# Patient Record
Sex: Female | Born: 1940 | Race: White | Hispanic: No | Marital: Married | Smoking: Never smoker
Health system: Southern US, Community
[De-identification: ages and names within clinical notes are randomized; demographics above are authoritative.]

## PROBLEM LIST (undated history)

## (undated) DIAGNOSIS — E119 Type 2 diabetes mellitus without complications: Secondary | ICD-10-CM

## (undated) DIAGNOSIS — I10 Essential (primary) hypertension: Secondary | ICD-10-CM

## (undated) DIAGNOSIS — E079 Disorder of thyroid, unspecified: Secondary | ICD-10-CM

## (undated) DIAGNOSIS — M1990 Unspecified osteoarthritis, unspecified site: Secondary | ICD-10-CM

---

## 2016-04-12 ENCOUNTER — Encounter (HOSPITAL_COMMUNITY): Payer: Self-pay

## 2016-04-12 ENCOUNTER — Emergency Department (HOSPITAL_COMMUNITY)
Admission: EM | Admit: 2016-04-12 | Discharge: 2016-04-12 | Disposition: A | Discharge status: Home / Self Care | Payer: Medicare Other | Attending: Emergency Medicine | Admitting: Emergency Medicine

## 2016-04-12 ENCOUNTER — Emergency Department (HOSPITAL_COMMUNITY): Payer: Medicare Other

## 2016-04-12 DIAGNOSIS — Y929 Unspecified place or not applicable: Secondary | ICD-10-CM | POA: Diagnosis not present

## 2016-04-12 DIAGNOSIS — S89392A Other physeal fracture of lower end of left fibula, initial encounter for closed fracture: Secondary | ICD-10-CM | POA: Insufficient documentation

## 2016-04-12 DIAGNOSIS — E119 Type 2 diabetes mellitus without complications: Secondary | ICD-10-CM | POA: Insufficient documentation

## 2016-04-12 DIAGNOSIS — R55 Syncope and collapse: Secondary | ICD-10-CM | POA: Diagnosis present

## 2016-04-12 DIAGNOSIS — S82892A Other fracture of left lower leg, initial encounter for closed fracture: Secondary | ICD-10-CM

## 2016-04-12 DIAGNOSIS — Y999 Unspecified external cause status: Secondary | ICD-10-CM | POA: Diagnosis not present

## 2016-04-12 DIAGNOSIS — W11XXXA Fall on and from ladder, initial encounter: Secondary | ICD-10-CM | POA: Diagnosis not present

## 2016-04-12 DIAGNOSIS — I10 Essential (primary) hypertension: Secondary | ICD-10-CM | POA: Insufficient documentation

## 2016-04-12 DIAGNOSIS — Y939 Activity, unspecified: Secondary | ICD-10-CM | POA: Insufficient documentation

## 2016-04-12 DIAGNOSIS — M1990 Unspecified osteoarthritis, unspecified site: Secondary | ICD-10-CM | POA: Insufficient documentation

## 2016-04-12 HISTORY — DX: Type 2 diabetes mellitus without complications: E11.9

## 2016-04-12 HISTORY — DX: Unspecified osteoarthritis, unspecified site: M19.90

## 2016-04-12 HISTORY — DX: Essential (primary) hypertension: I10

## 2016-04-12 HISTORY — DX: Disorder of thyroid, unspecified: E07.9

## 2016-04-12 LAB — CBC
HCT: 37.2 % (ref 36.0–46.0)
Hemoglobin: 12.5 g/dL (ref 12.0–15.0)
MCH: 29.4 pg (ref 26.0–34.0)
MCHC: 33.6 g/dL (ref 30.0–36.0)
MCV: 87.5 fL (ref 78.0–100.0)
PLATELETS: 227 10*3/uL (ref 150–400)
RBC: 4.25 MIL/uL (ref 3.87–5.11)
RDW: 14 % (ref 11.5–15.5)
WBC: 8.7 10*3/uL (ref 4.0–10.5)

## 2016-04-12 LAB — URINALYSIS, ROUTINE W REFLEX MICROSCOPIC
BILIRUBIN URINE: NEGATIVE
GLUCOSE, UA: NEGATIVE mg/dL
HGB URINE DIPSTICK: NEGATIVE
KETONES UR: NEGATIVE mg/dL
Leukocytes, UA: NEGATIVE
Nitrite: NEGATIVE
PROTEIN: NEGATIVE mg/dL
Specific Gravity, Urine: 1.021 (ref 1.005–1.030)
pH: 6 (ref 5.0–8.0)

## 2016-04-12 LAB — BASIC METABOLIC PANEL
Anion gap: 9 (ref 5–15)
BUN: 15 mg/dL (ref 6–20)
CHLORIDE: 103 mmol/L (ref 101–111)
CO2: 26 mmol/L (ref 22–32)
CREATININE: 0.89 mg/dL (ref 0.44–1.00)
Calcium: 9.4 mg/dL (ref 8.9–10.3)
GFR calc Af Amer: >60 mL/min (ref >60)
GFR calc non Af Amer: >60 mL/min (ref >60)
Glucose, Bld: 144 mg/dL — ABNORMAL HIGH (ref 65–99)
Potassium: 3.8 mmol/L (ref 3.5–5.1)
SODIUM: 138 mmol/L (ref 135–145)

## 2016-04-12 LAB — CBG MONITORING, ED: Glucose-Capillary: 170 mg/dL — ABNORMAL HIGH (ref 65–99)

## 2016-04-12 MED ORDER — OXYCODONE-ACETAMINOPHEN 5-325 MG PO TABS
1.0000 | ORAL_TABLET | Freq: Once | ORAL | Status: AC
  Administered 2016-04-12: 1 via ORAL
  Filled 2016-04-12: qty 1

## 2016-04-12 MED ORDER — OXYCODONE-ACETAMINOPHEN 5-325 MG PO TABS: 1.0000 | ORAL_TABLET | ORAL | Status: AC | PRN

## 2016-04-12 NOTE — ED Notes (Signed)
CBG 170 

## 2016-04-12 NOTE — ED Provider Notes (Signed)
CSN: [id number]     Arrival date & time 04/12/16  1441 History   First MD Initiated Contact with Patient 04/12/16 1625     Chief Complaint  Patient presents with  . Near Syncope  . Fall     (Consider location/radiation/quality/duration/timing/severity/associated sxs/prior Treatment) HPI Comments: Patient here complaining of pain to her left ankle after she fell off a stool today. States that she was standing on the stool when she became lightheaded and dizzy. Denied any chest pain or shortness of breath. She fell onto her left hip. No loss of consciousness or head injury. Denies any head or neck pain at this time. Does note some sharp left ankle pain is worse with trying to stand on it. Notes mild left hip pain without radicular symptoms. Denies any back discomfort. No knee pain noted. No treatment use prior to arrival  Patient is a 75 y.o. female presenting with near-syncope and fall. The history is provided by the patient.  Near Syncope  Fall    Past Medical History  Diagnosis Date  . Diabetes mellitus without complication (HCC)   . Hypertension   . Thyroid disease   . Arthritis    History reviewed. No pertinent past surgical history. History reviewed. No pertinent family history. Social History  Substance Use Topics  . Smoking status: Never Smoker   . Smokeless tobacco: Never Used  . Alcohol Use: No   OB History    No data available     Review of Systems  Cardiovascular: Positive for near-syncope.  All other systems reviewed and are negative.     Allergies  Bactrim and Penicillins  Home Medications   Prior to Admission medications   Not on File   BP 149/68 mmHg  Pulse 81  Temp(Src) 97.6 F (36.4 C) (Oral)  Resp 16  Ht 5' (1.524 m)  Wt 82.555 kg  BMI 35.54 kg/m2  SpO2 99% Physical Exam  Constitutional: She is oriented to person, place, and time. She appears well-developed and well-nourished.  Non-toxic appearance. No distress.  HENT:  Head:  Normocephalic and atraumatic.  Eyes: Conjunctivae, EOM and lids are normal. Pupils are equal, round, and reactive to light.  Neck: Normal range of motion. Neck supple. No tracheal deviation present. No thyroid mass present.  Cardiovascular: Normal rate, regular rhythm and normal heart sounds.  Exam reveals no gallop.   No murmur heard. Pulmonary/Chest: Effort normal and breath sounds normal. No stridor. No respiratory distress. She has no decreased breath sounds. She has no wheezes. She has no rhonchi. She has no rales.  Abdominal: Soft. Normal appearance and bowel sounds are normal. She exhibits no distension. There is no tenderness. There is no rebound and no CVA tenderness.  Musculoskeletal: Normal range of motion. She exhibits no edema or tenderness.       Legs:      Feet:  Neurological: She is alert and oriented to person, place, and time. She has normal strength. No cranial nerve deficit or sensory deficit. GCS eye subscore is 4. GCS verbal subscore is 5. GCS motor subscore is 6.  Skin: Skin is warm and dry. No abrasion and no rash noted.  Psychiatric: She has a normal mood and affect. Her speech is normal and behavior is normal.  Nursing note and vitals reviewed.   ED Course  Procedures (including critical care time) Labs Review Labs Reviewed  URINALYSIS, ROUTINE W REFLEX MICROSCOPIC (NOT AT [facility]) - Abnormal; Notable for the following:    APPearance CLOUDY (*)  All other components within normal limits  CBG MONITORING, ED - Abnormal; Notable for the following:    Glucose-Capillary 170 (*)    All other components within normal limits  CBC  BASIC METABOLIC PANEL    Imaging Review No results found. I have personally reviewed and evaluated these images and lab results as part of my medical decision-making.   EKG Interpretation   Date/Time:  Friday Apr 12 2016 15:28:40 EDT Ventricular Rate:  80 PR Interval:  136 QRS Duration: 81 QT Interval:  387 QTC Calculation: 446 R  Axis:   80 Text Interpretation:  Sinus rhythm Low voltage, precordial leads Confirmed  by Helyn Schwan  MD, Chaun Uemura ([id number]) on 04/12/2016 4:26:33 PM      MDM   Final diagnoses:  None   Patient is not orthostatic. Splint to be applied by orthopedic tech. Will give a dose of pain medication here and give referral to orthopedics on call    Lorre NickAnthony Asees Manfredi, MD 04/12/16 1816

## 2016-04-12 NOTE — ED Notes (Signed)
Pt reports feeling dizzy while on ladder then falling to the ground. Pt denies blood thinner use. Pt denies hitting her head. Pt c/o 8/10 left ankle, leg, and hip pain. Pt required assistance transition from POV to wheelchair. Pt A+OX4, speaking in complete sentences, in wheelchair.

## 2016-04-12 NOTE — Discharge Instructions (Signed)
Cast or Splint Care  Casts and splints support injured limbs and keep bones from moving while they heal. It is important to care for your cast or splint at home.    HOME CARE INSTRUCTIONS  · Keep the cast or splint uncovered during the drying period. It can take 24 to 48 hours to dry if it is made of plaster. A fiberglass cast will dry in less than 1 hour.  · Do not rest the cast on anything harder than a pillow for the first 24 hours.  · Do not put weight on your injured limb or apply pressure to the cast until your health care provider gives you permission.  · Keep the cast or splint dry. Wet casts or splints can lose their shape and may not support the limb as well. A wet cast that has lost its shape can also create harmful pressure on your skin when it dries. Also, wet skin can become infected.    Cover the cast or splint with a plastic bag when bathing or when out in the rain or snow. If the cast is on the trunk of the body, take sponge baths until the cast is removed.    If your cast does become wet, dry it with a towel or a blow dryer on the cool setting only.  · Keep your cast or splint clean. Soiled casts may be wiped with a moistened cloth.  · Do not place any hard or soft foreign objects under your cast or splint, such as cotton, toilet paper, lotion, or powder.  · Do not try to scratch the skin under the cast with any object. The object could get stuck inside the cast. Also, scratching could lead to an infection. If itching is a problem, use a blow dryer on a cool setting to relieve discomfort.  · Do not trim or cut your cast or remove padding from inside of it.  · Exercise all joints next to the injury that are not immobilized by the cast or splint. For example, if you have a long leg cast, exercise the hip joint and toes. If you have an arm cast or splint, exercise the shoulder, elbow, thumb, and fingers.  · Elevate your injured arm or leg on 1 or 2 pillows for the first 1 to 3 days to decrease  swelling and pain. It is best if you can comfortably elevate your cast so it is higher than your heart.  SEEK MEDICAL CARE IF:   · Your cast or splint cracks.  · Your cast or splint is too tight or too loose.  · You have unbearable itching inside the cast.  · Your cast becomes wet or develops a soft spot or area.  · You have a bad smell coming from inside your cast.  · You get an object stuck under your cast.  · Your skin around the cast becomes red or raw.  · You have new pain or worsening pain after the cast has been applied.  SEEK IMMEDIATE MEDICAL CARE IF:   · You have fluid leaking through the cast.  · You are unable to move your fingers or toes.  · You have discolored (blue or white), cool, painful, or very swollen fingers or toes beyond the cast.  · You have tingling or numbness around the injured area.  · You have severe pain or pressure under the cast.  · You have any difficulty with your breathing or have shortness of breath.  · You   have chest pain.     This information is not intended to replace advice given to you by your health care provider. Make sure you discuss any questions you have with your health care provider.     Document Released: 11/22/2000 Document Revised: 09/15/2013 Document Reviewed: 06/03/2013  Elsevier Interactive Patient Education ©2016 [employer]  Ankle Fracture  A fracture is a break in a bone. The ankle joint is made up of three bones. These include the lower (distal) sections of your lower leg bones, called the tibia and fibula, along with a bone in your foot, called the talus. Depending on how bad the break is and if more than one ankle joint bone is broken, a cast or splint is used to protect and keep your injured bone from moving while it heals. Sometimes, surgery is required to help the fracture heal properly.   There are two general types of fractures:  · Stable fracture. This includes a single fracture line through one bone, with no injury to ankle ligaments. A fracture of  the talus that does not have any displacement (movement of the bone on either side of the fracture line) is also stable.  · Unstable fracture. This includes more than one fracture line through one or more bones in the ankle joint. It also includes fractures that have displacement of the bone on either side of the fracture line.  CAUSES  · A direct blow to the ankle.    · Quickly and severely twisting your ankle.  · Trauma, such as a car accident or falling from a significant height.  RISK FACTORS  You may be at a higher risk of ankle fracture if:  · You have certain medical conditions.  · You are involved in high-impact sports.  · You are involved in a high-impact car accident.  SIGNS AND SYMPTOMS   · Tender and swollen ankle.  · Bruising around the injured ankle.  · Pain on movement of the ankle.  · Difficulty walking or putting weight on the ankle.  · A cold foot below the site of the ankle injury. This can occur if the blood vessels passing through your injured ankle were also damaged.  · Numbness in the foot below the site of the ankle injury.  DIAGNOSIS   An ankle fracture is usually diagnosed with a physical exam and X-rays. A CT scan may also be required for complex fractures.  TREATMENT   Stable fractures are treated with a cast or splint and using crutches to avoid putting weight on your injured ankle. This is followed by an ankle strengthening program. Some patients require a special type of cast, depending on other medical problems they may have. Unstable fractures require surgery to ensure the bones heal properly. Your health care provider will tell you what type of fracture you have and the best treatment for your condition.  HOME CARE INSTRUCTIONS   · Review correct crutch use with your health care provider and use your crutches as directed. Safe use of crutches is extremely important. Misuse of crutches can cause you to fall or cause injury to nerves in your hands or armpits.  · Do not put weight or  pressure on the injured ankle until directed by your health care provider.  · To lessen the swelling, keep the injured leg elevated while sitting or lying down.  · Apply ice to the injured area:    Put ice in a plastic bag.    Place a towel between your   cast and the bag.    Leave the ice on for 20 minutes, 2-3 times a day.  · If you have a plaster or fiberglass cast:    Do not try to scratch the skin under the cast with any objects. This can increase your risk of skin infection.    Check the skin around the cast every day. You may put lotion on any red or sore areas.    Keep your cast dry and clean.  · If you have a plaster splint:    Wear the splint as directed.    You may loosen the elastic around the splint if your toes become numb, tingle, or turn cold or blue.  · Do not put pressure on any part of your cast or splint; it may break. Rest your cast only on a pillow the first 24 hours until it is fully hardened.  · Your cast or splint can be protected during bathing with a plastic bag sealed to your skin with medical tape. Do not lower the cast or splint into water.  · Take medicines as directed by your health care provider. Only take over-the-counter or prescription medicines for pain, discomfort, or fever as directed by your health care provider.  · Do not drive a vehicle until your health care provider specifically tells you it is safe to do so.  · If your health care provider has given you a follow-up appointment, it is very important to keep that appointment. Not keeping the appointment could result in a chronic or permanent injury, pain, and disability. If you have any problem keeping the appointment, call the facility for assistance.  SEEK MEDICAL CARE IF:  You develop increased swelling or discomfort.  SEEK IMMEDIATE MEDICAL CARE IF:   · Your cast gets damaged or breaks.  · You have continued severe pain.  · You develop new pain or swelling after the cast was put on.  · Your skin or toenails below the  injury turn blue or gray.  · Your skin or toenails below the injury feel cold, numb, or have loss of sensitivity to touch.  · There is a bad smell or pus draining from under the cast.  MAKE SURE YOU:   · Understand these instructions.  · Will watch your condition.  · Will get help right away if you are not doing well or get worse.     This information is not intended to replace advice given to you by your health care provider. Make sure you discuss any questions you have with your health care provider.     Document Released: 11/22/2000 Document Revised: 11/30/2013 Document Reviewed: 06/24/2013  Elsevier Interactive Patient Education ©2016 [employer]

## 2017-02-14 IMAGING — CR DG HIP (WITH OR WITHOUT PELVIS) 2-3V*L*
3 series · 3 of 3 positions shown · non-contrast
Comparison: None.

CLINICAL DATA: Left hip pain after fall

EXAM:
DG HIP (WITH OR WITHOUT PELVIS) 2-3V LEFT

[x pelvis]
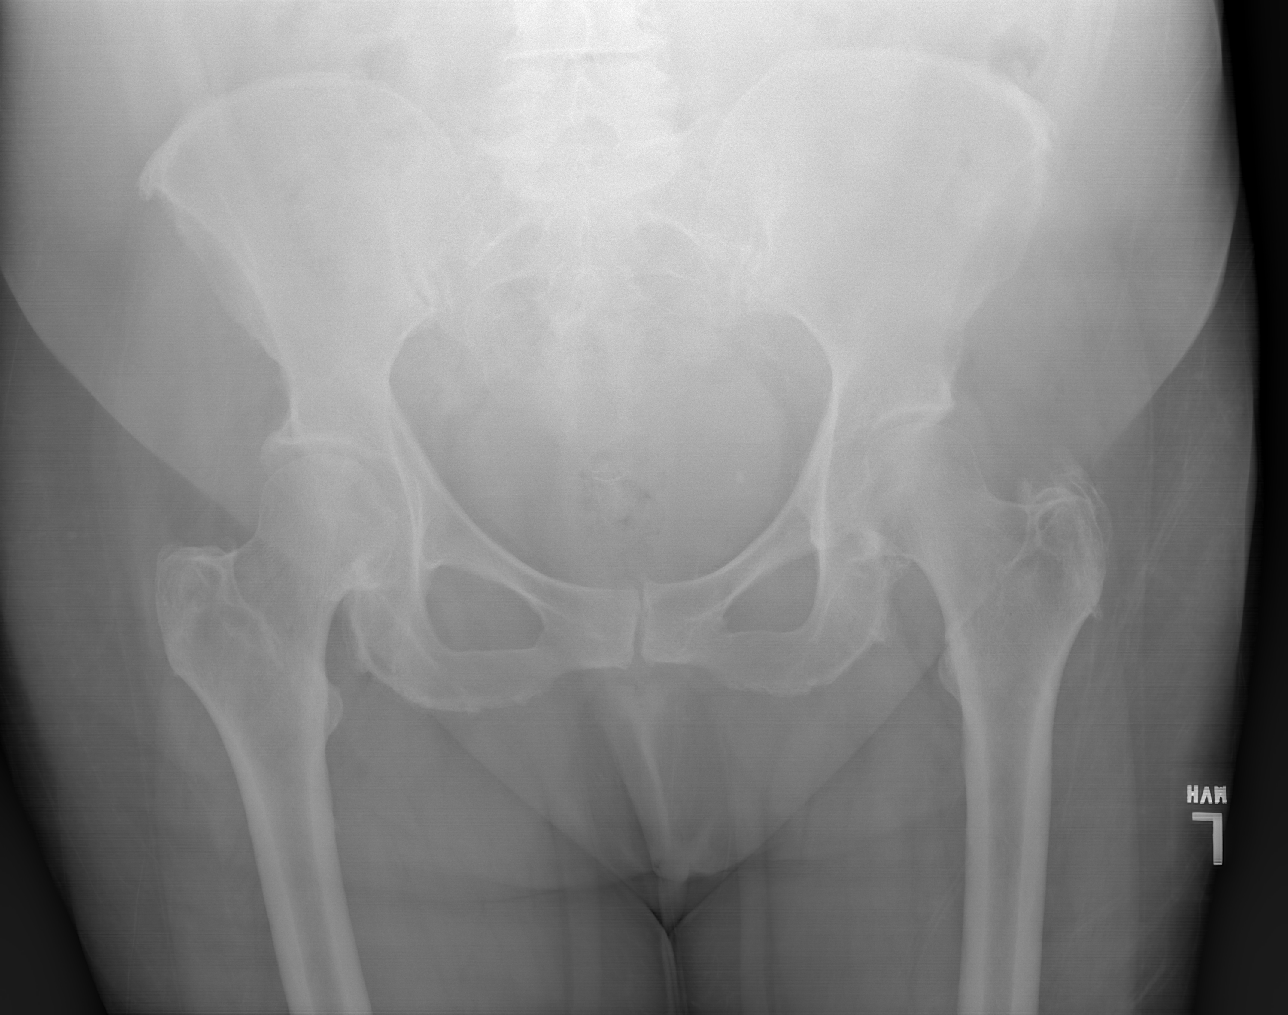

[x hip ap left]
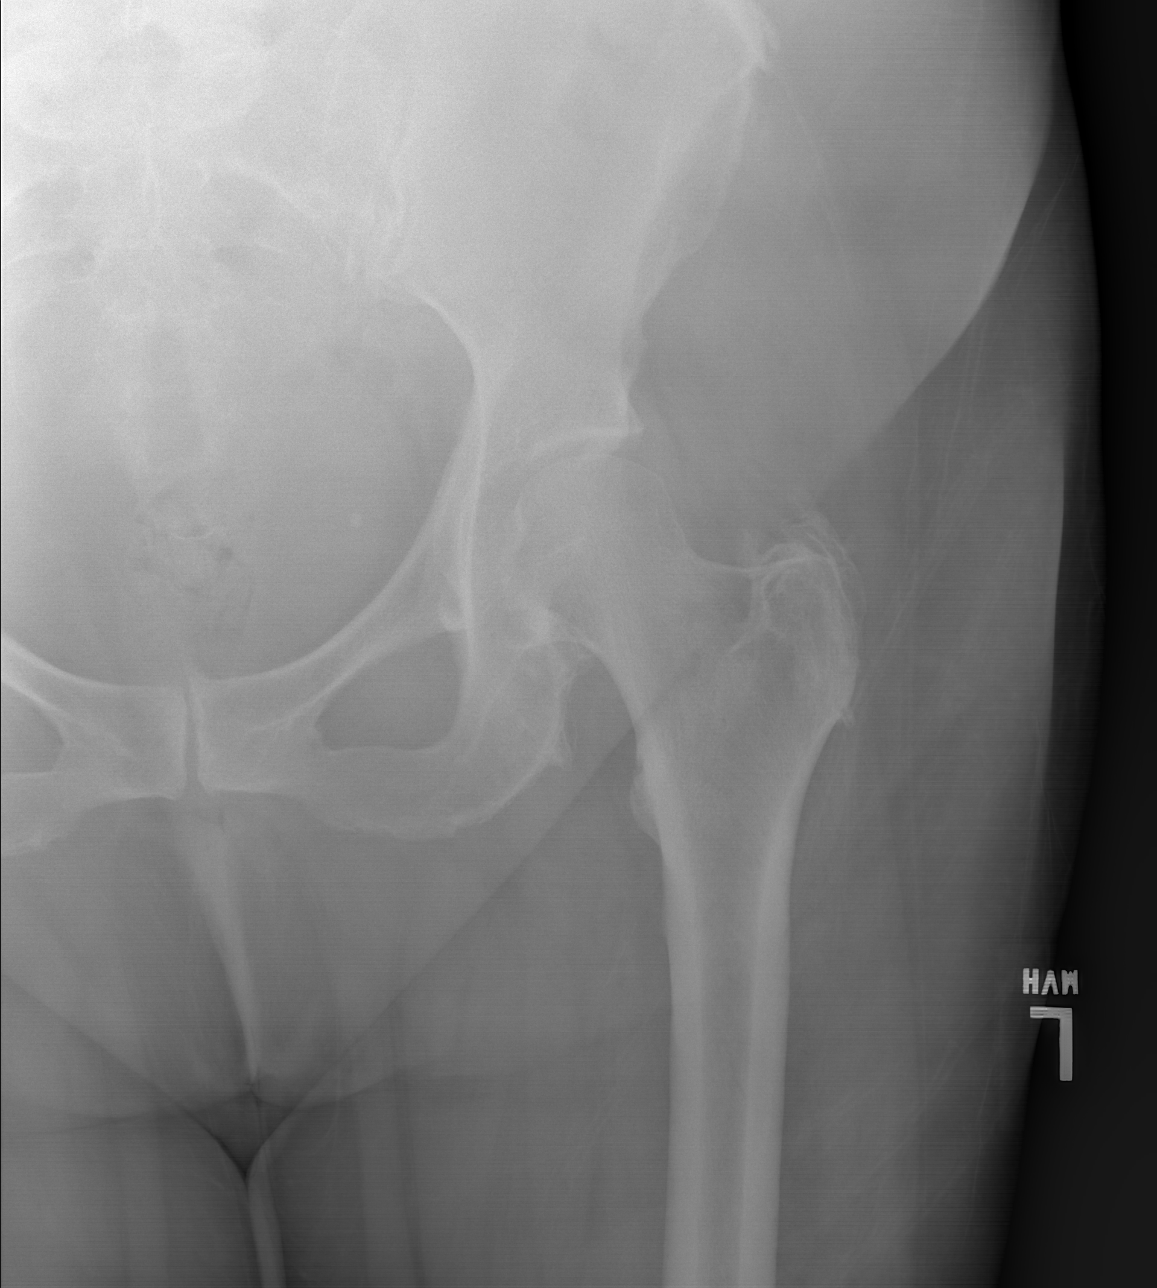

[x hip lat left]
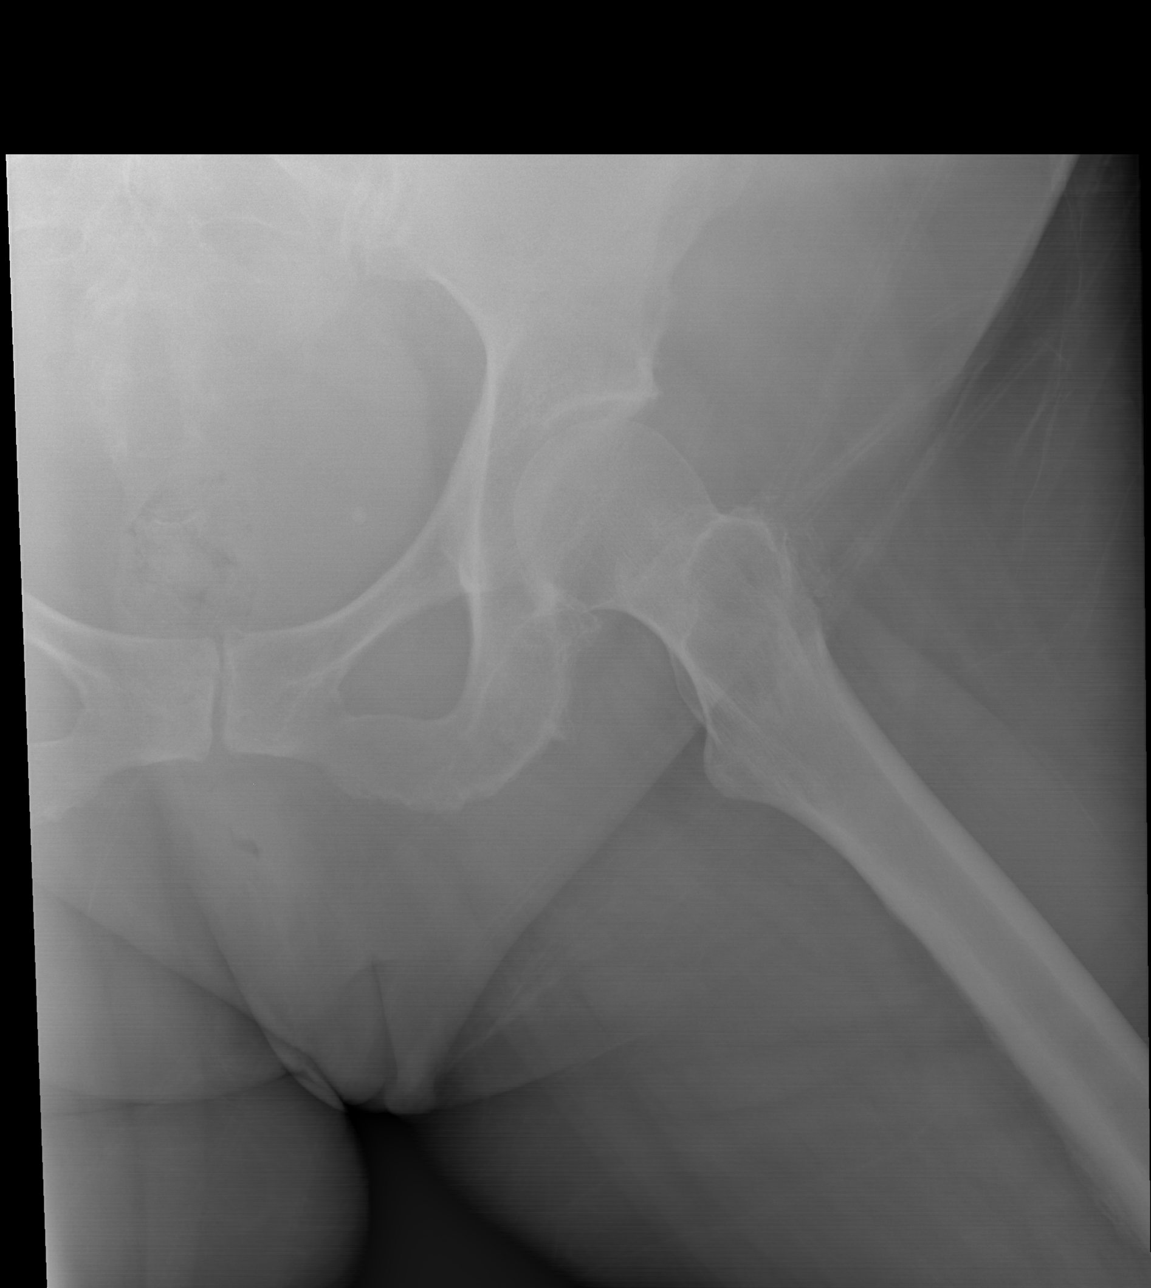

[3 of 3 positions shown; findings below may reference images not displayed]

FINDINGS: No fracture or dislocation in the left hip. No pelvic fracture or
diastasis. Bulky enthesophytes at the left greater trochanter. No
appreciable left hip arthropathy. Degenerative changes in the
visualized lower lumbar spine.
IMPRESSION: No left hip fracture or malalignment.
